# Patient Record
Sex: Male | Born: 1960 | Race: White | Hispanic: No | Marital: Married | Smoking: Never smoker
Health system: Southern US, Community
[De-identification: ages and names within clinical notes are randomized; demographics above are authoritative.]

## PROBLEM LIST (undated history)

## (undated) DIAGNOSIS — I10 Essential (primary) hypertension: Secondary | ICD-10-CM

## (undated) DIAGNOSIS — R195 Other fecal abnormalities: Secondary | ICD-10-CM

## (undated) HISTORY — DX: Other fecal abnormalities: R19.5

## (undated) HISTORY — DX: Essential (primary) hypertension: I10

---

## 2008-10-20 HISTORY — PX: NECK SURGERY: SHX720

## 2009-10-23 ENCOUNTER — Encounter: Admission: RE | Admit: 2009-10-23 | Discharge: 2009-10-23 | Payer: Self-pay | Admitting: Neurological Surgery

## 2009-11-07 ENCOUNTER — Ambulatory Visit (HOSPITAL_COMMUNITY): Admission: RE | Admit: 2009-11-07 | Discharge: 2009-11-08 | Payer: Self-pay | Admitting: Neurological Surgery

## 2009-12-03 ENCOUNTER — Encounter: Admission: RE | Admit: 2009-12-03 | Discharge: 2009-12-03 | Payer: Self-pay | Admitting: Neurological Surgery

## 2010-01-28 ENCOUNTER — Encounter: Admission: RE | Admit: 2010-01-28 | Discharge: 2010-01-28 | Payer: Self-pay | Admitting: Neurological Surgery

## 2010-05-20 ENCOUNTER — Encounter: Admission: RE | Admit: 2010-05-20 | Discharge: 2010-05-20 | Payer: Self-pay | Admitting: Neurological Surgery

## 2011-01-06 LAB — DIFFERENTIAL
Basophils Absolute: 0 10*3/uL (ref 0.0–0.1)
Basophils Relative: 0 % (ref 0–1)
Eosinophils Absolute: 0.3 10*3/uL (ref 0.0–0.7)
Lymphocytes Relative: 35 % (ref 12–46)
Lymphs Abs: 3.9 10*3/uL (ref 0.7–4.0)
Monocytes Absolute: 0.7 10*3/uL (ref 0.1–1.0)
Neutro Abs: 6.2 10*3/uL (ref 1.7–7.7)

## 2011-01-06 LAB — BASIC METABOLIC PANEL
BUN: 16 mg/dL (ref 6–23)
CO2: 28 mEq/L (ref 19–32)
Calcium: 8.8 mg/dL (ref 8.4–10.5)
Glucose, Bld: 102 mg/dL — ABNORMAL HIGH (ref 70–99)

## 2011-01-06 LAB — APTT: aPTT: 30 seconds (ref 24–37)

## 2011-01-06 LAB — CBC
RDW: 13.9 % (ref 11.5–15.5)
WBC: 11.1 10*3/uL — ABNORMAL HIGH (ref 4.0–10.5)
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
## 2011-11-01 IMAGING — CR DG CERVICAL SPINE 1V
1 series · 1 of 1 positions shown · non-contrast
Comparison: 12/03/2009

CLINICAL DATA: Cervical surgery 3 weeks ago.

CERVICAL SPINE - 1 VIEW

[view not recorded]
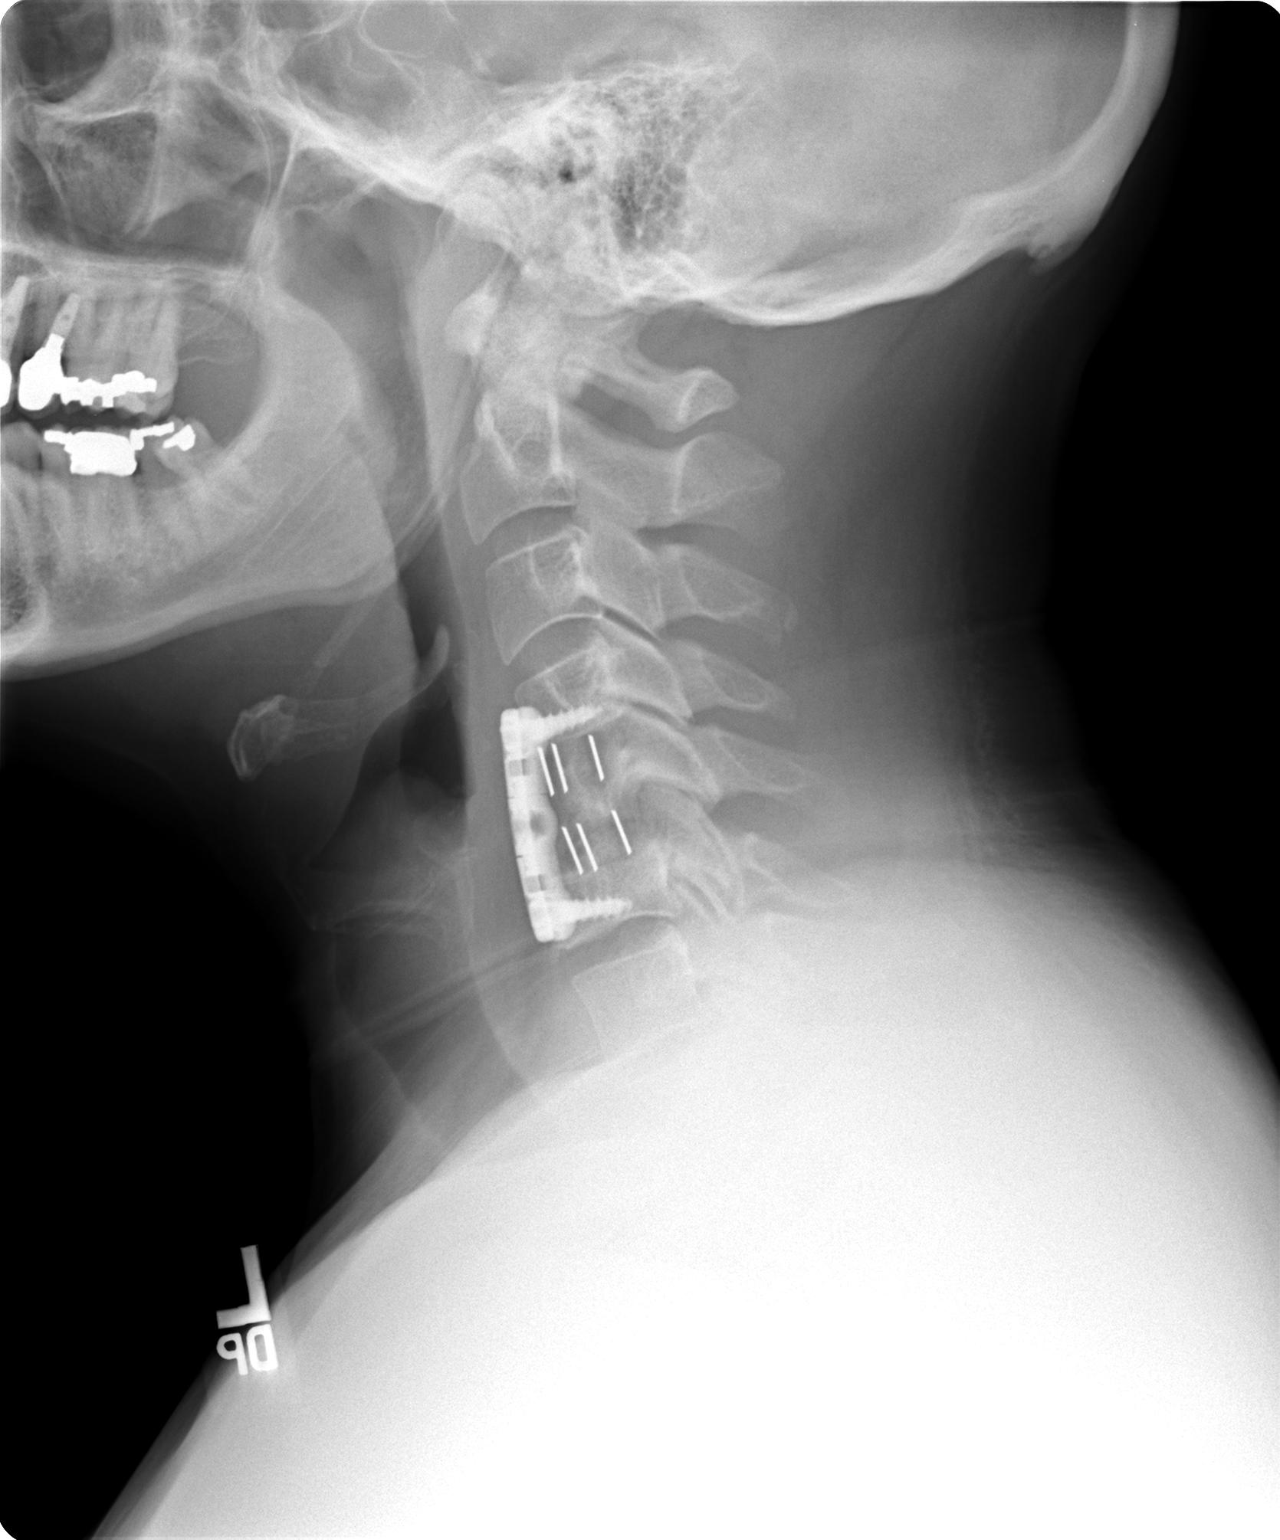

[1 of 1 positions shown; findings below may reference images not displayed]

FINDINGS: Cervical spine is visualized from the occiput to the C7-
T1 junction.  The patient is status post C4-6 anterior cervical
fusion.  There may have been partial C5 corpectomy.  Straightening
of the normal cervical lordosis.  Prevertebral soft tissues are
within normal limits.
IMPRESSION: C4-6 anterior cervical fusion, stable.

## 2013-12-12 ENCOUNTER — Other Ambulatory Visit (HOSPITAL_COMMUNITY): Payer: Self-pay | Admitting: Urology

## 2013-12-12 DIAGNOSIS — R972 Elevated prostate specific antigen [PSA]: Secondary | ICD-10-CM

## 2013-12-29 ENCOUNTER — Ambulatory Visit (HOSPITAL_COMMUNITY): Admission: RE | Admit: 2013-12-29 | Payer: BC Managed Care – PPO | Source: Ambulatory Visit

## 2017-10-20 HISTORY — PX: KNEE SURGERY: SHX244

## 2017-12-11 DIAGNOSIS — Z0001 Encounter for general adult medical examination with abnormal findings: Secondary | ICD-10-CM | POA: Diagnosis not present

## 2017-12-11 DIAGNOSIS — Z6829 Body mass index (BMI) 29.0-29.9, adult: Secondary | ICD-10-CM | POA: Diagnosis not present

## 2017-12-25 DIAGNOSIS — J0101 Acute recurrent maxillary sinusitis: Secondary | ICD-10-CM | POA: Diagnosis not present

## 2017-12-25 DIAGNOSIS — Z6829 Body mass index (BMI) 29.0-29.9, adult: Secondary | ICD-10-CM | POA: Diagnosis not present

## 2018-02-17 DIAGNOSIS — D485 Neoplasm of uncertain behavior of skin: Secondary | ICD-10-CM | POA: Diagnosis not present

## 2018-02-17 DIAGNOSIS — L821 Other seborrheic keratosis: Secondary | ICD-10-CM | POA: Diagnosis not present

## 2018-02-17 DIAGNOSIS — D2262 Melanocytic nevi of left upper limb, including shoulder: Secondary | ICD-10-CM | POA: Diagnosis not present

## 2018-02-17 DIAGNOSIS — L570 Actinic keratosis: Secondary | ICD-10-CM | POA: Diagnosis not present

## 2018-05-17 DIAGNOSIS — M25552 Pain in left hip: Secondary | ICD-10-CM | POA: Diagnosis not present

## 2018-05-17 DIAGNOSIS — Z6829 Body mass index (BMI) 29.0-29.9, adult: Secondary | ICD-10-CM | POA: Diagnosis not present

## 2018-08-28 DIAGNOSIS — M7120 Synovial cyst of popliteal space [Baker], unspecified knee: Secondary | ICD-10-CM | POA: Diagnosis not present

## 2018-09-03 DIAGNOSIS — S83241A Other tear of medial meniscus, current injury, right knee, initial encounter: Secondary | ICD-10-CM | POA: Diagnosis not present

## 2018-09-03 DIAGNOSIS — Z6829 Body mass index (BMI) 29.0-29.9, adult: Secondary | ICD-10-CM | POA: Diagnosis not present

## 2018-12-08 DIAGNOSIS — M25561 Pain in right knee: Secondary | ICD-10-CM | POA: Diagnosis not present

## 2018-12-08 DIAGNOSIS — M7121 Synovial cyst of popliteal space [Baker], right knee: Secondary | ICD-10-CM | POA: Diagnosis not present

## 2018-12-08 DIAGNOSIS — X58XXXA Exposure to other specified factors, initial encounter: Secondary | ICD-10-CM | POA: Diagnosis not present

## 2018-12-08 DIAGNOSIS — S83241A Other tear of medial meniscus, current injury, right knee, initial encounter: Secondary | ICD-10-CM | POA: Diagnosis not present

## 2018-12-14 DIAGNOSIS — Z6829 Body mass index (BMI) 29.0-29.9, adult: Secondary | ICD-10-CM | POA: Diagnosis not present

## 2018-12-14 DIAGNOSIS — S83231A Complex tear of medial meniscus, current injury, right knee, initial encounter: Secondary | ICD-10-CM | POA: Diagnosis not present

## 2018-12-29 DIAGNOSIS — X501XXA Overexertion from prolonged static or awkward postures, initial encounter: Secondary | ICD-10-CM | POA: Diagnosis not present

## 2018-12-29 DIAGNOSIS — M1711 Unilateral primary osteoarthritis, right knee: Secondary | ICD-10-CM | POA: Diagnosis not present

## 2018-12-29 DIAGNOSIS — S83231A Complex tear of medial meniscus, current injury, right knee, initial encounter: Secondary | ICD-10-CM | POA: Diagnosis not present

## 2018-12-29 DIAGNOSIS — I10 Essential (primary) hypertension: Secondary | ICD-10-CM | POA: Diagnosis not present

## 2018-12-31 DIAGNOSIS — S83231A Complex tear of medial meniscus, current injury, right knee, initial encounter: Secondary | ICD-10-CM | POA: Diagnosis not present

## 2018-12-31 DIAGNOSIS — S83241A Other tear of medial meniscus, current injury, right knee, initial encounter: Secondary | ICD-10-CM | POA: Diagnosis not present

## 2018-12-31 DIAGNOSIS — X501XXA Overexertion from prolonged static or awkward postures, initial encounter: Secondary | ICD-10-CM | POA: Diagnosis not present

## 2018-12-31 DIAGNOSIS — M1711 Unilateral primary osteoarthritis, right knee: Secondary | ICD-10-CM | POA: Diagnosis not present

## 2018-12-31 DIAGNOSIS — I10 Essential (primary) hypertension: Secondary | ICD-10-CM | POA: Diagnosis not present

## 2019-01-12 DIAGNOSIS — M25661 Stiffness of right knee, not elsewhere classified: Secondary | ICD-10-CM | POA: Diagnosis not present

## 2019-01-12 DIAGNOSIS — M6281 Muscle weakness (generalized): Secondary | ICD-10-CM | POA: Diagnosis not present

## 2019-01-12 DIAGNOSIS — M25461 Effusion, right knee: Secondary | ICD-10-CM | POA: Diagnosis not present

## 2019-01-12 DIAGNOSIS — M25561 Pain in right knee: Secondary | ICD-10-CM | POA: Diagnosis not present

## 2019-01-14 DIAGNOSIS — M25561 Pain in right knee: Secondary | ICD-10-CM | POA: Diagnosis not present

## 2019-01-14 DIAGNOSIS — M25661 Stiffness of right knee, not elsewhere classified: Secondary | ICD-10-CM | POA: Diagnosis not present

## 2019-01-14 DIAGNOSIS — M25461 Effusion, right knee: Secondary | ICD-10-CM | POA: Diagnosis not present

## 2019-01-14 DIAGNOSIS — M6281 Muscle weakness (generalized): Secondary | ICD-10-CM | POA: Diagnosis not present

## 2019-01-19 DIAGNOSIS — M25461 Effusion, right knee: Secondary | ICD-10-CM | POA: Diagnosis not present

## 2019-01-19 DIAGNOSIS — M6281 Muscle weakness (generalized): Secondary | ICD-10-CM | POA: Diagnosis not present

## 2019-01-19 DIAGNOSIS — M25561 Pain in right knee: Secondary | ICD-10-CM | POA: Diagnosis not present

## 2019-01-19 DIAGNOSIS — M25661 Stiffness of right knee, not elsewhere classified: Secondary | ICD-10-CM | POA: Diagnosis not present

## 2019-01-21 DIAGNOSIS — M25461 Effusion, right knee: Secondary | ICD-10-CM | POA: Diagnosis not present

## 2019-01-21 DIAGNOSIS — M6281 Muscle weakness (generalized): Secondary | ICD-10-CM | POA: Diagnosis not present

## 2019-01-21 DIAGNOSIS — M25661 Stiffness of right knee, not elsewhere classified: Secondary | ICD-10-CM | POA: Diagnosis not present

## 2019-01-21 DIAGNOSIS — M25561 Pain in right knee: Secondary | ICD-10-CM | POA: Diagnosis not present

## 2019-01-24 DIAGNOSIS — M25561 Pain in right knee: Secondary | ICD-10-CM | POA: Diagnosis not present

## 2019-01-24 DIAGNOSIS — M6281 Muscle weakness (generalized): Secondary | ICD-10-CM | POA: Diagnosis not present

## 2019-01-24 DIAGNOSIS — M25661 Stiffness of right knee, not elsewhere classified: Secondary | ICD-10-CM | POA: Diagnosis not present

## 2019-01-24 DIAGNOSIS — M25461 Effusion, right knee: Secondary | ICD-10-CM | POA: Diagnosis not present

## 2019-01-27 DIAGNOSIS — M25461 Effusion, right knee: Secondary | ICD-10-CM | POA: Diagnosis not present

## 2019-01-27 DIAGNOSIS — M25661 Stiffness of right knee, not elsewhere classified: Secondary | ICD-10-CM | POA: Diagnosis not present

## 2019-01-27 DIAGNOSIS — M6281 Muscle weakness (generalized): Secondary | ICD-10-CM | POA: Diagnosis not present

## 2019-01-27 DIAGNOSIS — M25561 Pain in right knee: Secondary | ICD-10-CM | POA: Diagnosis not present

## 2019-02-21 DIAGNOSIS — L570 Actinic keratosis: Secondary | ICD-10-CM | POA: Diagnosis not present

## 2019-02-21 DIAGNOSIS — D1801 Hemangioma of skin and subcutaneous tissue: Secondary | ICD-10-CM | POA: Diagnosis not present

## 2019-02-21 DIAGNOSIS — L821 Other seborrheic keratosis: Secondary | ICD-10-CM | POA: Diagnosis not present

## 2019-06-21 DIAGNOSIS — I10 Essential (primary) hypertension: Secondary | ICD-10-CM | POA: Diagnosis not present

## 2019-06-21 DIAGNOSIS — D509 Iron deficiency anemia, unspecified: Secondary | ICD-10-CM | POA: Diagnosis not present

## 2019-06-21 DIAGNOSIS — E039 Hypothyroidism, unspecified: Secondary | ICD-10-CM | POA: Diagnosis not present

## 2019-06-21 DIAGNOSIS — E291 Testicular hypofunction: Secondary | ICD-10-CM | POA: Diagnosis not present

## 2019-06-21 DIAGNOSIS — K429 Umbilical hernia without obstruction or gangrene: Secondary | ICD-10-CM | POA: Diagnosis not present

## 2019-06-21 DIAGNOSIS — R739 Hyperglycemia, unspecified: Secondary | ICD-10-CM | POA: Diagnosis not present

## 2019-06-21 DIAGNOSIS — E782 Mixed hyperlipidemia: Secondary | ICD-10-CM | POA: Diagnosis not present

## 2019-06-25 DIAGNOSIS — Z23 Encounter for immunization: Secondary | ICD-10-CM | POA: Diagnosis not present

## 2019-06-25 DIAGNOSIS — Z1212 Encounter for screening for malignant neoplasm of rectum: Secondary | ICD-10-CM | POA: Diagnosis not present

## 2019-06-25 DIAGNOSIS — Z0001 Encounter for general adult medical examination with abnormal findings: Secondary | ICD-10-CM | POA: Diagnosis not present

## 2019-09-09 DIAGNOSIS — Z23 Encounter for immunization: Secondary | ICD-10-CM | POA: Diagnosis not present

## 2019-10-26 DIAGNOSIS — M25519 Pain in unspecified shoulder: Secondary | ICD-10-CM | POA: Diagnosis not present

## 2019-10-26 DIAGNOSIS — Z6829 Body mass index (BMI) 29.0-29.9, adult: Secondary | ICD-10-CM | POA: Diagnosis not present

## 2019-12-14 DIAGNOSIS — Z23 Encounter for immunization: Secondary | ICD-10-CM | POA: Diagnosis not present

## 2020-01-11 DIAGNOSIS — Z23 Encounter for immunization: Secondary | ICD-10-CM | POA: Diagnosis not present

## 2020-02-16 DIAGNOSIS — S83231D Complex tear of medial meniscus, current injury, right knee, subsequent encounter: Secondary | ICD-10-CM | POA: Diagnosis not present

## 2020-02-16 DIAGNOSIS — M1711 Unilateral primary osteoarthritis, right knee: Secondary | ICD-10-CM | POA: Diagnosis not present

## 2020-02-21 DIAGNOSIS — D1801 Hemangioma of skin and subcutaneous tissue: Secondary | ICD-10-CM | POA: Diagnosis not present

## 2020-02-21 DIAGNOSIS — L570 Actinic keratosis: Secondary | ICD-10-CM | POA: Diagnosis not present

## 2020-03-15 DIAGNOSIS — S83231D Complex tear of medial meniscus, current injury, right knee, subsequent encounter: Secondary | ICD-10-CM | POA: Diagnosis not present

## 2020-03-15 DIAGNOSIS — M25561 Pain in right knee: Secondary | ICD-10-CM | POA: Diagnosis not present

## 2020-03-15 DIAGNOSIS — M1711 Unilateral primary osteoarthritis, right knee: Secondary | ICD-10-CM | POA: Diagnosis not present

## 2020-03-15 DIAGNOSIS — Z6829 Body mass index (BMI) 29.0-29.9, adult: Secondary | ICD-10-CM | POA: Diagnosis not present

## 2020-03-27 DIAGNOSIS — M1711 Unilateral primary osteoarthritis, right knee: Secondary | ICD-10-CM | POA: Diagnosis not present

## 2020-03-27 DIAGNOSIS — Z6829 Body mass index (BMI) 29.0-29.9, adult: Secondary | ICD-10-CM | POA: Diagnosis not present

## 2020-06-06 DIAGNOSIS — M1711 Unilateral primary osteoarthritis, right knee: Secondary | ICD-10-CM | POA: Diagnosis not present

## 2020-06-06 DIAGNOSIS — M7120 Synovial cyst of popliteal space [Baker], unspecified knee: Secondary | ICD-10-CM | POA: Diagnosis not present

## 2020-06-06 DIAGNOSIS — S83241A Other tear of medial meniscus, current injury, right knee, initial encounter: Secondary | ICD-10-CM | POA: Diagnosis not present

## 2020-09-03 DIAGNOSIS — R5383 Other fatigue: Secondary | ICD-10-CM | POA: Diagnosis not present

## 2020-09-03 DIAGNOSIS — Z20828 Contact with and (suspected) exposure to other viral communicable diseases: Secondary | ICD-10-CM | POA: Diagnosis not present

## 2020-09-03 DIAGNOSIS — R6883 Chills (without fever): Secondary | ICD-10-CM | POA: Diagnosis not present

## 2020-09-04 DIAGNOSIS — Z23 Encounter for immunization: Secondary | ICD-10-CM | POA: Diagnosis not present

## 2020-09-04 DIAGNOSIS — U071 COVID-19: Secondary | ICD-10-CM | POA: Diagnosis not present

## 2021-07-10 ENCOUNTER — Encounter (INDEPENDENT_AMBULATORY_CARE_PROVIDER_SITE_OTHER): Payer: Self-pay | Admitting: *Deleted

## 2021-07-25 ENCOUNTER — Other Ambulatory Visit (INDEPENDENT_AMBULATORY_CARE_PROVIDER_SITE_OTHER): Payer: Self-pay

## 2021-07-25 ENCOUNTER — Encounter (INDEPENDENT_AMBULATORY_CARE_PROVIDER_SITE_OTHER): Payer: Self-pay

## 2021-07-25 ENCOUNTER — Ambulatory Visit (INDEPENDENT_AMBULATORY_CARE_PROVIDER_SITE_OTHER): Payer: BC Managed Care – PPO | Admitting: Gastroenterology

## 2021-07-25 ENCOUNTER — Other Ambulatory Visit: Payer: Self-pay

## 2021-07-25 ENCOUNTER — Telehealth (INDEPENDENT_AMBULATORY_CARE_PROVIDER_SITE_OTHER): Payer: Self-pay

## 2021-07-25 ENCOUNTER — Encounter (INDEPENDENT_AMBULATORY_CARE_PROVIDER_SITE_OTHER): Payer: Self-pay | Admitting: Gastroenterology

## 2021-07-25 DIAGNOSIS — E611 Iron deficiency: Secondary | ICD-10-CM

## 2021-07-25 DIAGNOSIS — R768 Other specified abnormal immunological findings in serum: Secondary | ICD-10-CM | POA: Diagnosis not present

## 2021-07-25 DIAGNOSIS — K625 Hemorrhage of anus and rectum: Secondary | ICD-10-CM | POA: Diagnosis not present

## 2021-07-25 DIAGNOSIS — R195 Other fecal abnormalities: Secondary | ICD-10-CM

## 2021-07-25 MED ORDER — PEG 3350-KCL-NA BICARB-NACL 420 G PO SOLR: 4000.0000 mL | ORAL | 0 refills | Status: AC

## 2021-07-25 NOTE — Progress Notes (Signed)
Joseph Acosta, M.D. Gastroenterology & Hepatology [facility] [address] [place], [place] [address] Primary Care Physician: Joseph Chimera, MD [address] [place] [address]  Referring MD: PCP  Chief Complaint: heme positive stools and iron deficiency  History of Present Illness: Joseph Acosta is a 60 y.o. male with past medical history of hypertension, who presents for evaluation of heme positive stools and iron deficiency.  Patient reports he went to his physical with his PCP and was found to have iron deficiency. He had previous history of iron deficiency but never required a blood transfusion. He states that he has not presented any shrotness of breaht or fatigue but noticed episodes of "spinning" when laying down, which has happened for a couple of times. The patient denies having any nausea, vomiting, fever, chills, hematemesis, abdominal distention, abdominal pain, diarrhea, jaundice, pruritus or weight loss.  Patient had blood work-up performed on 07/03/2021 which showed CMP with normal liver enzymes, BUN 15 and creatinine 1.05, normal electrolytes, CBC with hemoglobin of 13.3, MCV 81, although cell count 8.8 and platelets 22, iron profile showed TIBC of 413 normal, iron saturation 9%, ferritin 9 and iron of 39 which were low.  Vitamin B12 was 762 and folic acid was 9.5.  FOBT was positive.  Patient was started on Niferex BID couple of weeks ago.  States for the last 3 months he has noticed his stools were thin as a pencil. 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 Now his stools are larger in size. No previous melena. He states that in 2009 he noticed some fresh blood in stool, and this has recurred a couple of times a year since then but is usually self limited.  He has been on different missions in the past in [place] and throughout [place].  He reports that he donated blood in the  past and he tested positive, but was "told he had a false positive result".  Last EGD: Never Last Colonoscopy:2016 -performed by Dr. Catalina Acosta at [facility], normal colonoscopy  FHx: neg for any gastrointestinal/liver disease, maternal aunt and grandfather died of colon cancer Social: neg smoking, alcohol or illicit drug use Surgical: no abdominal surgeries  Past Medical History: Past Medical History:  Diagnosis Date   Benign essential HTN    Occult blood in stools     Past Surgical History: Past Surgical History:  Procedure Laterality Date   KNEE SURGERY Right 2019   NECK SURGERY  2010    Family History: Family History  Problem Relation Age of Onset   Heart disease Mother    Heart disease Father    Healthy Father    Colon cancer Maternal Grandfather    Colon cancer Other     Social History: Social History   Tobacco Use  Smoking Status Never  Smokeless Tobacco Never   Social History   Substance and Sexual Activity  Alcohol Use Never   Social History   Substance and Sexual Activity  Drug Use Never    Allergies: No Known Allergies  Medications: Current Outpatient Medications  Medication Sig Dispense Refill   iron polysaccharides (NIFEREX) 150 MG capsule Take 150 mg by mouth 2 (two) times daily.     lisinopril-hydrochlorothiazide (ZESTORETIC) 20-12.5 MG tablet Take 1 tablet by mouth daily.     traZODone (DESYREL) 100 MG tablet Take 100 mg by mouth at bedtime. 1-2 Qhs     No current  facility-administered medications for this visit.    Review of Systems: GENERAL: negative for malaise, night sweats HEENT: No changes in hearing or vision, no nose bleeds or other nasal problems. NECK: Negative for lumps, goiter, pain and significant neck swelling RESPIRATORY: Negative for cough, wheezing CARDIOVASCULAR: Negative for chest pain, leg swelling, palpitations, orthopnea GI: SEE HPI MUSCULOSKELETAL: Negative for joint pain or swelling, back pain, and muscle  pain. SKIN: Negative for lesions, rash PSYCH: Negative for sleep disturbance, mood disorder and recent psychosocial stressors. HEMATOLOGY Negative for prolonged bleeding, bruising easily, and swollen nodes. ENDOCRINE: Negative for cold or heat intolerance, polyuria, polydipsia and goiter. NEURO: negative for tremor, gait imbalance, syncope and seizures. The remainder of the review of systems is noncontributory.   Physical Exam: BP 111/72 (BP Location: Left Arm, Patient Position: Sitting, Cuff Size: Small)   Pulse (!) 53   Temp 98.2 F (36.8 C) (Oral)   Ht 5' 6.5" (1.689 m)   Wt 177 lb 1.6 oz (80.3 kg)   BMI 28.16 kg/m  GENERAL: The patient is AO x3, in no acute distress. HEENT: Head is normocephalic and atraumatic. EOMI are intact. Mouth is well hydrated and without lesions. NECK: Supple. No masses LUNGS: Clear to auscultation. No presence of rhonchi/wheezing/rales. Adequate chest expansion HEART: RRR, normal s1 and s2. ABDOMEN: Soft, nontender, no guarding, no peritoneal signs, and nondistended. BS +. No masses. EXTREMITIES: Without any cyanosis, clubbing, rash, lesions or edema. NEUROLOGIC: AOx3, no focal motor deficit. SKIN: no jaundice, no rashes   Imaging/Labs: as above  I personally reviewed and interpreted the available labs, imaging and endoscopic files.  Impression and Plan: Joseph Acosta is a 60 y.o. male with past medical history of hypertension, who presents for evaluation of heme positive stools and iron deficiency.  The patient has not presented anemia but had presence of low iron stores most recent blood work-up.  He has presented intermittent rectal bleeding very occasionally but it has not been large in amount and has not presented the symptoms frequently.  He is tolerating his iron supplementation adequately.  At this point, we will need to investigate this further with an EGD and colonoscopy.  We will also need to check for presence of parasites in his stool  as he has been in countries where parasites are endemic and could lead to iron deficiency.  Patient understood and agreed.  Finally, it is unclear if he indeed had a false positive hepatitis B testing in the past.  He does not have any risk factors and has not presented any symptoms related to the disease.  Due to this, we will confirm this with serology.  - Schedule EGD and colonoscopy - Continue oral iron -Check hepatitis B surface antigen, antibody, core antibody and DNA PCR -Check ova and parasite and stool  All questions were answered.      Joseph Blazing, MD Gastroenterology and Hepatology [facility]

## 2021-07-25 NOTE — Patient Instructions (Signed)
Schedule EGD and colonoscopy Continue oral iron Perform blood workup Perform stool workup

## 2021-07-25 NOTE — Telephone Encounter (Signed)
LeighAnn Dalasia Predmore, CMA  

## 2021-07-26 ENCOUNTER — Encounter (INDEPENDENT_AMBULATORY_CARE_PROVIDER_SITE_OTHER): Payer: Self-pay

## 2021-07-29 LAB — HEPATITIS B DNA, ULTRAQUANTITATIVE, PCR
Hepatitis B DNA (Calc): <1 Log IU/mL
Hepatitis B DNA: <10 IU/mL

## 2021-07-29 LAB — HEPATITIS B CORE ANTIBODY, TOTAL: Hep B Core Total Ab: NONREACTIVE

## 2021-07-29 LAB — HEPATITIS B SURFACE ANTIBODY,QUALITATIVE: Hep B S Ab: NONREACTIVE

## 2021-07-29 LAB — HEPATITIS B SURFACE ANTIGEN: Hepatitis B Surface Ag: NONREACTIVE

## 2021-07-30 ENCOUNTER — Telehealth (INDEPENDENT_AMBULATORY_CARE_PROVIDER_SITE_OTHER): Payer: Self-pay | Admitting: Internal Medicine

## 2021-07-30 NOTE — Telephone Encounter (Signed)
FYI

## 2021-07-30 NOTE — Telephone Encounter (Signed)
I spoke with the patient to confirm he wanted to cancel. He states he does. I have called the short stay center and left a detailed message that patient was cancelling his procedure for 10/18, to please remove from the schedule.

## 2021-07-30 NOTE — Telephone Encounter (Signed)
Thanks for the update

## 2021-07-30 NOTE — Telephone Encounter (Signed)
Patient called the office stated he has a colonoscopy on 10/18 wants to cancel - stated he had to pay $3800 dollars out of pocket

## 2021-08-01 ENCOUNTER — Encounter (INDEPENDENT_AMBULATORY_CARE_PROVIDER_SITE_OTHER): Payer: Self-pay

## 2021-08-02 ENCOUNTER — Encounter (HOSPITAL_COMMUNITY): Payer: BC Managed Care – PPO

## 2021-08-06 ENCOUNTER — Ambulatory Visit (HOSPITAL_COMMUNITY)
Admission: RE | Admit: 2021-08-06 | Payer: BC Managed Care – PPO | Source: Home / Self Care | Admitting: Gastroenterology

## 2021-08-06 ENCOUNTER — Encounter (HOSPITAL_COMMUNITY): Admission: RE | Payer: Self-pay | Source: Home / Self Care

## 2021-08-06 SURGERY — COLONOSCOPY WITH PROPOFOL
Anesthesia: Monitor Anesthesia Care

## 2021-11-25 ENCOUNTER — Ambulatory Visit (INDEPENDENT_AMBULATORY_CARE_PROVIDER_SITE_OTHER): Payer: BC Managed Care – PPO | Admitting: Gastroenterology
